# Patient Record
Sex: Female | Born: 1945 | Race: Black or African American | Hispanic: No | Marital: Married | Smoking: Former smoker
Health system: Southern US, Community
[De-identification: ages and names within clinical notes are randomized; demographics above are authoritative.]

## PROBLEM LIST (undated history)

## (undated) DIAGNOSIS — E7800 Pure hypercholesterolemia, unspecified: Secondary | ICD-10-CM

## (undated) DIAGNOSIS — C50919 Malignant neoplasm of unspecified site of unspecified female breast: Secondary | ICD-10-CM

## (undated) DIAGNOSIS — E039 Hypothyroidism, unspecified: Secondary | ICD-10-CM

## (undated) DIAGNOSIS — A6000 Herpesviral infection of urogenital system, unspecified: Secondary | ICD-10-CM

## (undated) HISTORY — DX: Herpesviral infection of urogenital system, unspecified: A60.00

## (undated) HISTORY — PX: BREAST SURGERY: SHX581

## (undated) HISTORY — DX: Hypothyroidism, unspecified: E03.9

## (undated) HISTORY — DX: Pure hypercholesterolemia, unspecified: E78.00

## (undated) HISTORY — PX: TUBAL LIGATION: SHX77

## (undated) HISTORY — PX: BREAST EXCISIONAL BIOPSY: SUR124

## (undated) HISTORY — DX: Malignant neoplasm of unspecified site of unspecified female breast: C50.919

---

## 2004-11-12 DIAGNOSIS — C50919 Malignant neoplasm of unspecified site of unspecified female breast: Secondary | ICD-10-CM

## 2004-11-12 HISTORY — PX: BREAST SURGERY: SHX581

## 2004-11-12 HISTORY — DX: Malignant neoplasm of unspecified site of unspecified female breast: C50.919

## 2005-05-03 ENCOUNTER — Encounter: Admission: RE | Admit: 2005-05-03 | Discharge: 2005-05-03 | Payer: Self-pay | Admitting: Internal Medicine

## 2006-07-26 ENCOUNTER — Encounter: Admission: RE | Admit: 2006-07-26 | Discharge: 2006-07-26 | Payer: Self-pay | Admitting: Internal Medicine

## 2006-08-06 ENCOUNTER — Encounter (INDEPENDENT_AMBULATORY_CARE_PROVIDER_SITE_OTHER): Payer: Self-pay | Admitting: Specialist

## 2006-08-06 ENCOUNTER — Encounter: Admission: RE | Admit: 2006-08-06 | Discharge: 2006-08-06 | Payer: Self-pay | Admitting: Internal Medicine

## 2006-08-06 ENCOUNTER — Encounter (INDEPENDENT_AMBULATORY_CARE_PROVIDER_SITE_OTHER): Payer: Self-pay | Admitting: Diagnostic Radiology

## 2006-08-14 ENCOUNTER — Ambulatory Visit (HOSPITAL_COMMUNITY): Admission: RE | Admit: 2006-08-14 | Discharge: 2006-08-14 | Payer: Self-pay | Admitting: Internal Medicine

## 2006-09-06 ENCOUNTER — Encounter: Admission: RE | Admit: 2006-09-06 | Discharge: 2006-09-06 | Payer: Self-pay | Admitting: General Surgery

## 2006-09-10 ENCOUNTER — Encounter: Admission: RE | Admit: 2006-09-10 | Discharge: 2006-09-10 | Payer: Self-pay | Admitting: General Surgery

## 2006-09-10 ENCOUNTER — Ambulatory Visit (HOSPITAL_BASED_OUTPATIENT_CLINIC_OR_DEPARTMENT_OTHER): Admission: RE | Admit: 2006-09-10 | Discharge: 2006-09-10 | Payer: Self-pay | Admitting: General Surgery

## 2006-09-10 ENCOUNTER — Encounter (INDEPENDENT_AMBULATORY_CARE_PROVIDER_SITE_OTHER): Payer: Self-pay | Admitting: Specialist

## 2006-09-17 ENCOUNTER — Ambulatory Visit: Payer: Self-pay | Admitting: Oncology

## 2006-09-19 ENCOUNTER — Ambulatory Visit: Admission: RE | Admit: 2006-09-19 | Discharge: 2006-10-22 | Payer: Self-pay | Admitting: *Deleted

## 2007-02-13 ENCOUNTER — Encounter: Admission: RE | Admit: 2007-02-13 | Discharge: 2007-02-13 | Payer: Self-pay | Admitting: General Surgery

## 2007-04-04 IMAGING — CR DG CHEST 2V
2 series · 2 of 2 positions shown · non-contrast
Comparison: None.

CLINICAL DATA: Preop respiratory exam for breast cancer.
 TWO VIEW CHEST:

[w chest pa]
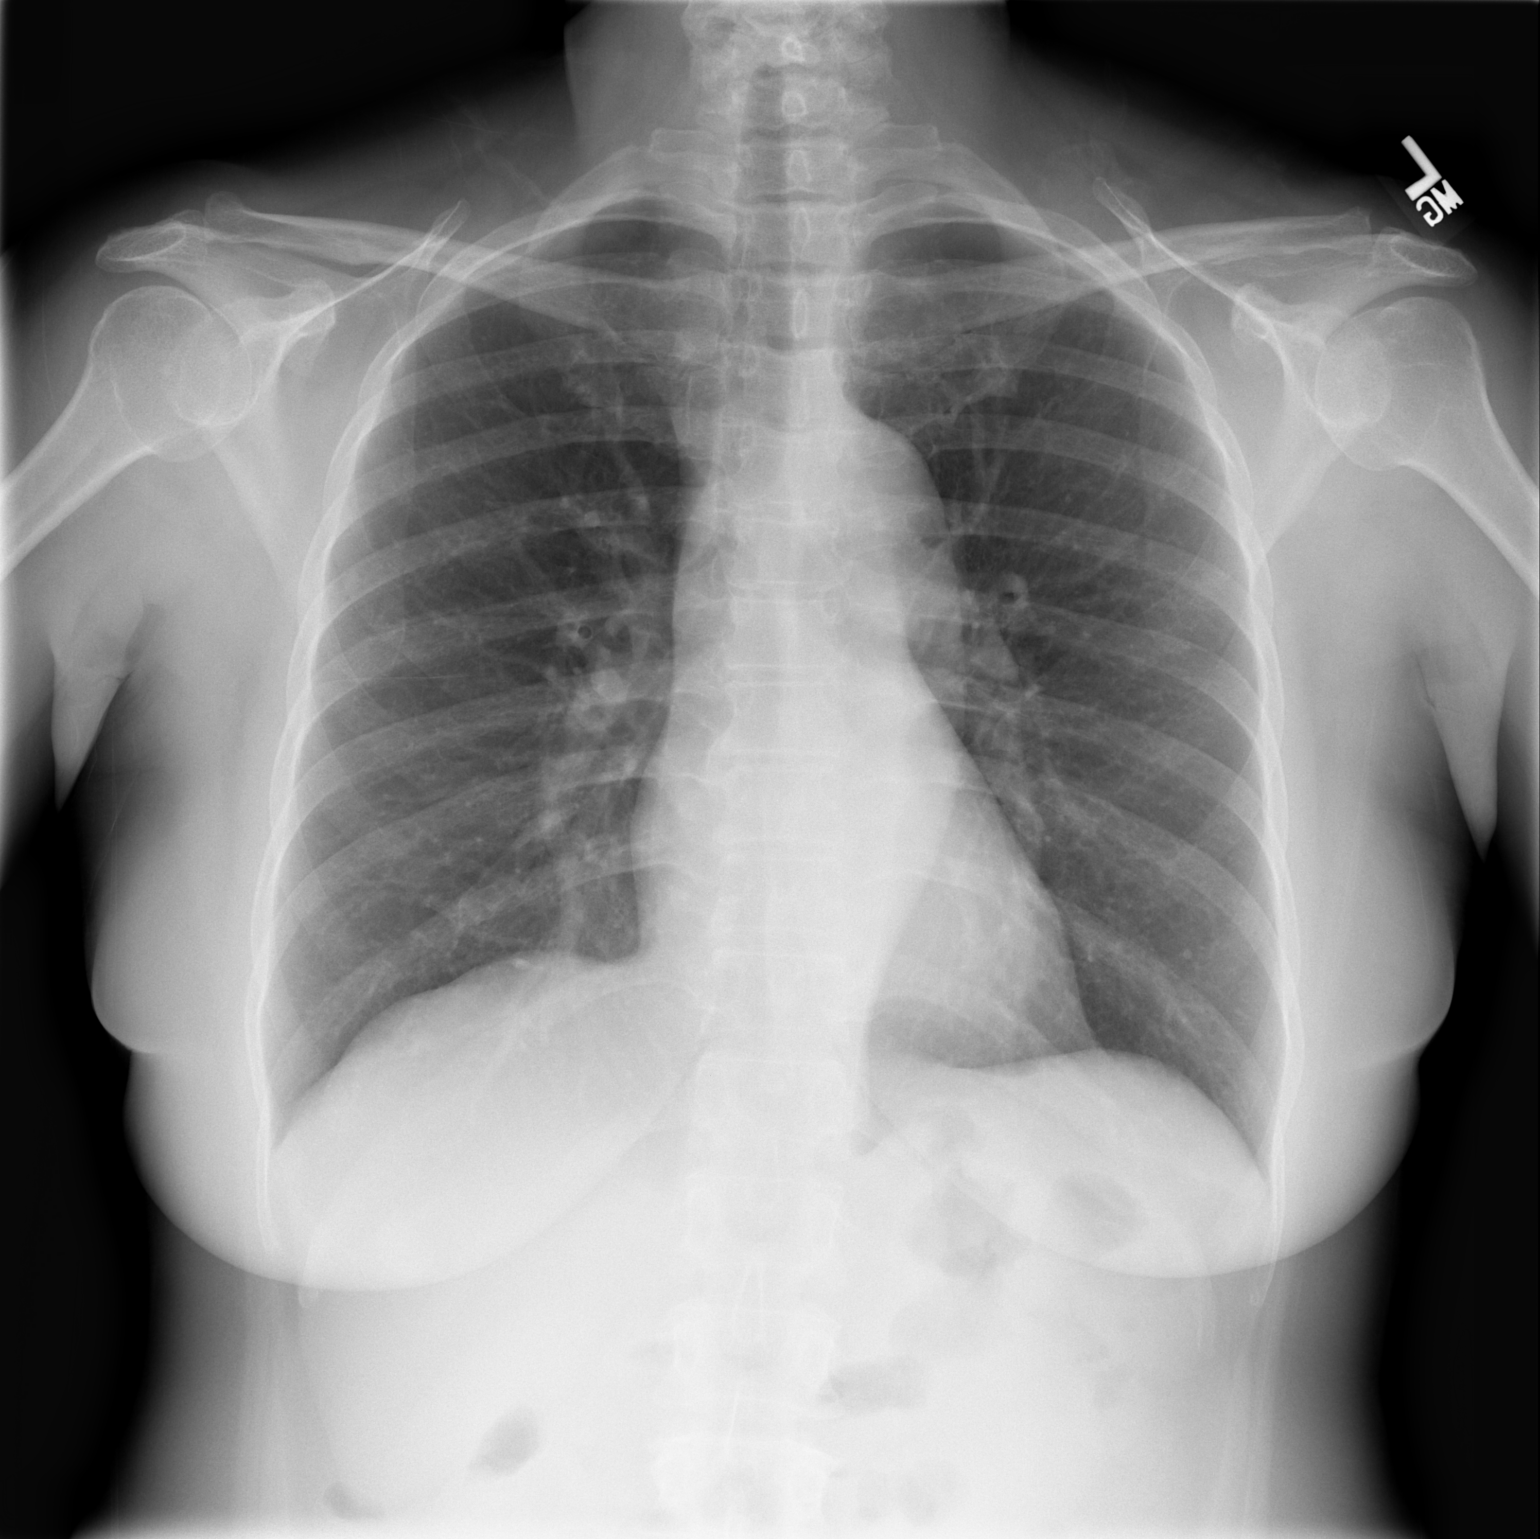

[w chest lat]
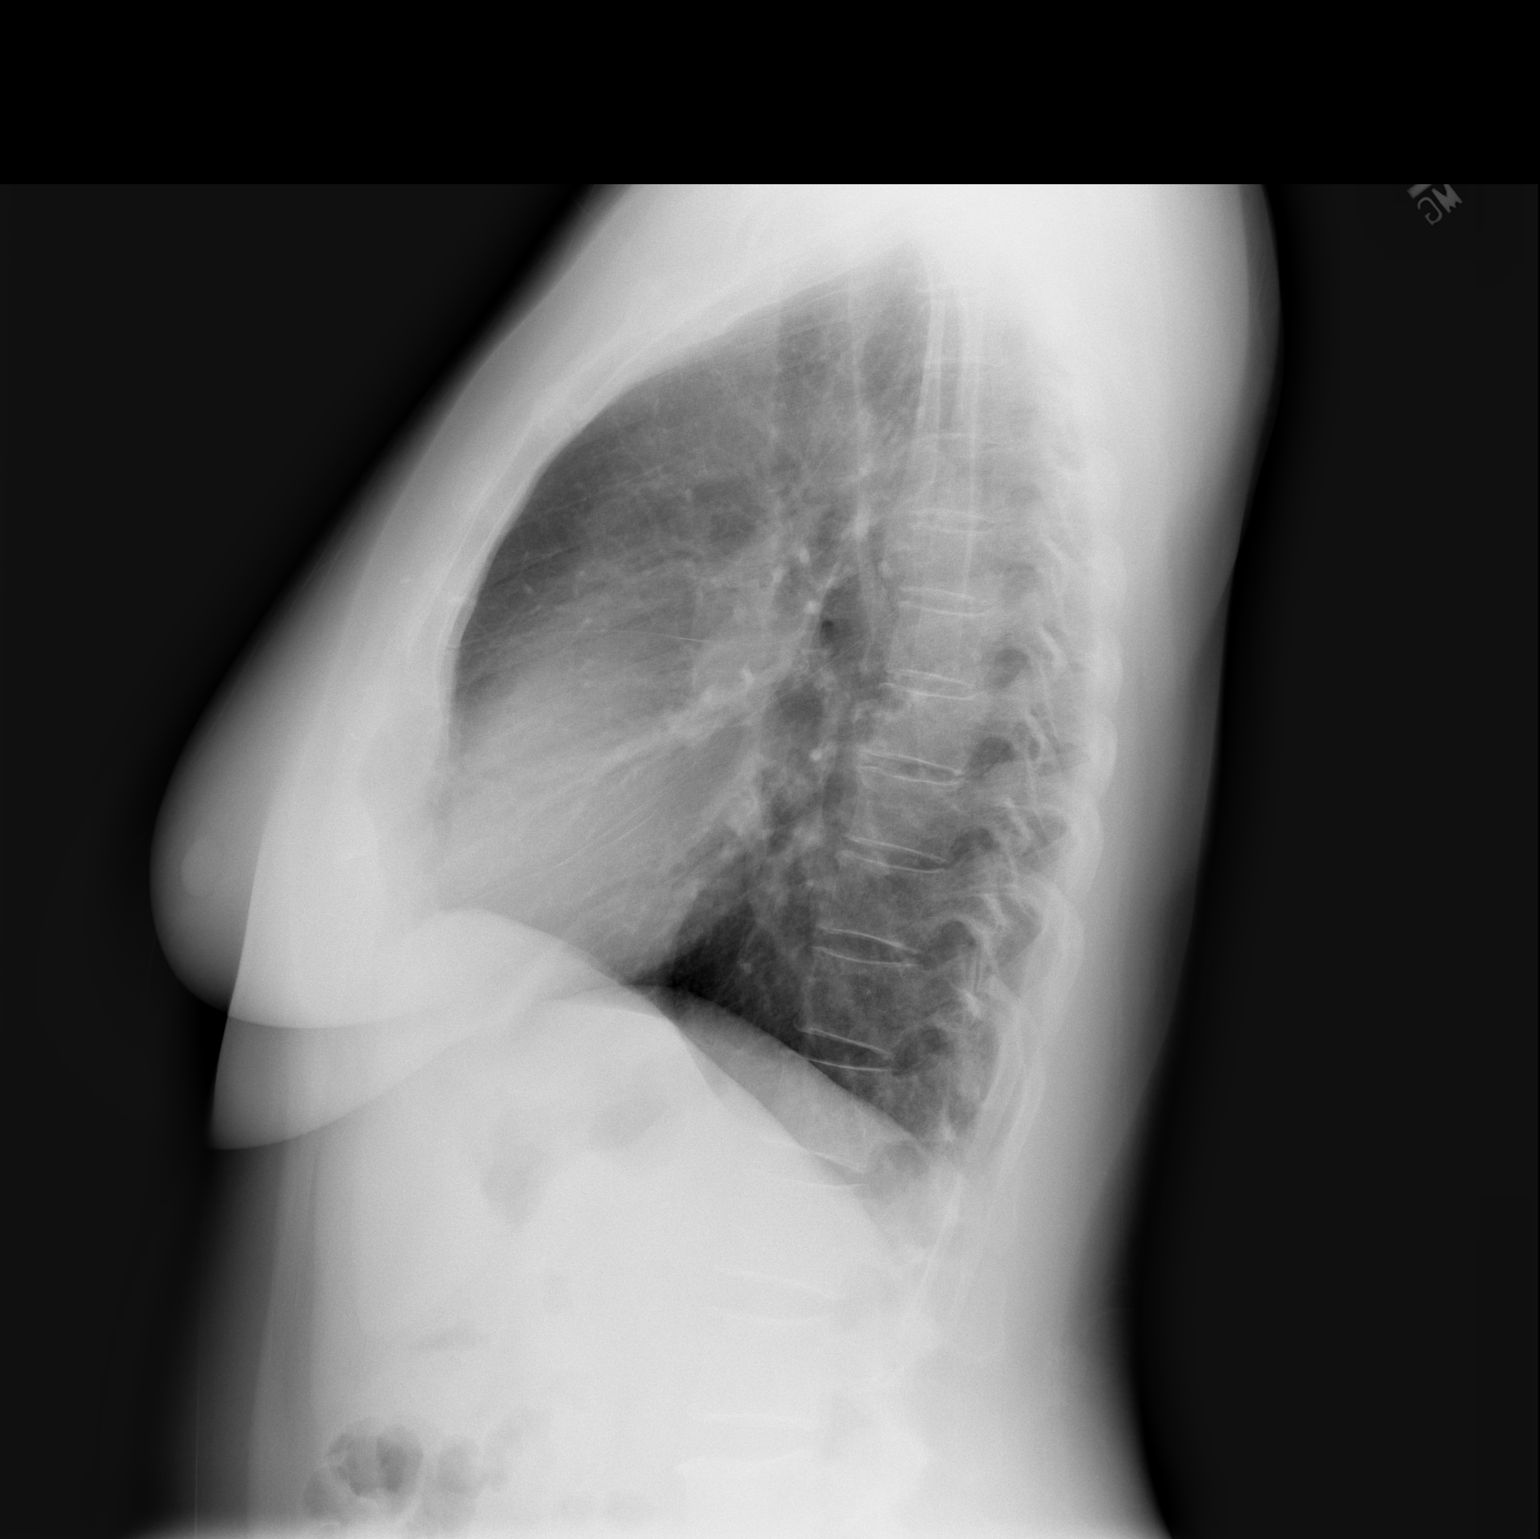

[2 of 2 positions shown; findings below may reference images not displayed]

The cardiac silhouette, mediastinum, and pulmonary vasculature are within normal limits.  Both lungs are clear.  The osseous structures are unremarkable.
IMPRESSION: Normal chest x-ray.

## 2008-08-17 ENCOUNTER — Encounter: Admission: RE | Admit: 2008-08-17 | Discharge: 2008-08-17 | Payer: Self-pay | Admitting: Internal Medicine

## 2008-09-17 ENCOUNTER — Other Ambulatory Visit: Admission: RE | Admit: 2008-09-17 | Discharge: 2008-09-17 | Payer: Self-pay | Admitting: Obstetrics & Gynecology

## 2009-08-31 ENCOUNTER — Encounter: Admission: RE | Admit: 2009-08-31 | Discharge: 2009-08-31 | Payer: Self-pay | Admitting: Internal Medicine

## 2010-09-28 ENCOUNTER — Encounter: Admission: RE | Admit: 2010-09-28 | Discharge: 2010-09-28 | Payer: Self-pay | Admitting: Internal Medicine

## 2010-12-03 ENCOUNTER — Encounter: Payer: Self-pay | Admitting: Internal Medicine

## 2011-03-30 NOTE — Op Note (Signed)
Cheryl Dean, Cheryl Dean           ACCOUNT NO.:  [id number]   MEDICAL RECORD NO.:  [id number]          PATIENT TYPE:  AMB   LOCATION:  DSC                          FACILITY:  [facility]   PHYSICIAN:  Timothy E. Earlene Plater, M.D. DATE OF BIRTH:  [birth year]   DATE OF PROCEDURE:  09/10/2006  DATE OF DISCHARGE:                                 OPERATIVE REPORT   PREOPERATIVE DIAGNOSIS:  Ductal carcinoma in situ left breast.   POSTOPERATIVE DIAGNOSIS:  Ductal carcinoma in situ left breast.   PROCEDURE:  Needle directed left partial mastectomy, blue dye injection and  sentinel node biopsy, left axilla.   SURGEON:  Timothy E. Earlene Plater, M.D.   ANESTHESIA:  General.   The patient has been seen and evaluated with abnormal mammograms, MRI, and  core biopsy all indicative of one small area in the left breast upper  quadrant with a DCIS.  She has been carefully counseled and is ready to  proceed with the surgery.  [facility]  excellent needle localization and was seen by nuclear medicine for injection  of sulfur colloid.  She is seen, identified, and the left breast marked.   The patient was taken to the operating room and placed supine.  General LMA  anesthesia was provided.  Her left arm was gently out stretched. The left  breast was inspected.  The wire entered the breast between the 12 and 1  o'clock positions angling posterior with a slight tilt to the inferior. By x-  ray, it penetrated the suspicious calcifications.  The nipple was prepped  and draped and blue dye injection was accomplished subcutaneously, then a  gentle massage carried out for five minutes.   The entire breast and chest was then prepped and draped in the usual  fashion.  Marcaine 0.25% plain was used prior to each incision.  The  NeoProbe was used and isolated one area of the axilla that was hot and this  area was anesthetized and opened. A single 1-cm lymph node that was not blue  was  identified.  It was dissected from the tissue. It was hot and was  submitted as node number one, hot not blue.  Further scrutiny of the axilla  revealed no further areas, no visible blue nodes, and no palpable nodes.  That wound was covered and attention was turned to the breast.   A curvilinear incision was designed just below the entrance of the needle  after Marcaine.  This was carried down and at the junction of the deep and  superficial subcu, a generous partial mastectomy was accomplished completely  encircling the wire and this was done down to the prepectoral fascia.  The  resulting mass was approximately 6 by 6 cm.  It was carefully marked with  sutures and indicated on the path report.  Bleeding had been controlled.  At  this point, we heard from the pathologist, Dr. Clelia Croft, that the lymph node was  negative.  Once the partial mastectomy had been submitted to x-ray, we  closed the axilla wound with Monocryl in layers.  The breast  wound was  carefully scrutinized for bleeding and was dry.  We then heard from x-ray  that the mass completely encompassed the needle, the shaft, and the  calcifications.  Therefore, the specimen was sent marked to pathology for  routine.  The wound was  closed in layers with Monocryl and Steri-Strips on both wounds.  Final  counts were correct.  She tolerated it well, was awakened, and taken to the  recovery room in good condition.  Written and verbal instructions are given  along with Vicodin, #36, and she will be seen and followed as an outpatient.      Timothy E. Earlene Plater, M.D.  Electronically Signed     TED/MEDQ  D:  09/10/2006  T:  09/10/2006  Job:  [id number]

## 2011-10-17 ENCOUNTER — Other Ambulatory Visit: Payer: Self-pay | Admitting: Internal Medicine

## 2011-10-17 DIAGNOSIS — Z853 Personal history of malignant neoplasm of breast: Secondary | ICD-10-CM

## 2011-11-01 ENCOUNTER — Ambulatory Visit
Admission: RE | Admit: 2011-11-01 | Discharge: 2011-11-01 | Disposition: A | Discharge status: Home / Self Care | Payer: Medicare Other | Source: Ambulatory Visit | Attending: Internal Medicine | Admitting: Internal Medicine

## 2011-11-01 ENCOUNTER — Other Ambulatory Visit: Payer: Self-pay | Admitting: Internal Medicine

## 2011-11-01 DIAGNOSIS — Z853 Personal history of malignant neoplasm of breast: Secondary | ICD-10-CM

## 2011-11-01 DIAGNOSIS — Z9889 Other specified postprocedural states: Secondary | ICD-10-CM

## 2011-12-18 ENCOUNTER — Other Ambulatory Visit: Payer: Self-pay | Admitting: Gastroenterology

## 2012-10-13 ENCOUNTER — Other Ambulatory Visit: Payer: Self-pay | Admitting: Internal Medicine

## 2012-10-13 DIAGNOSIS — Z853 Personal history of malignant neoplasm of breast: Secondary | ICD-10-CM

## 2012-10-13 DIAGNOSIS — Z9889 Other specified postprocedural states: Secondary | ICD-10-CM

## 2012-11-18 ENCOUNTER — Ambulatory Visit
Admission: RE | Admit: 2012-11-18 | Discharge: 2012-11-18 | Disposition: A | Discharge status: Home / Self Care | Payer: Medicare Other | Source: Ambulatory Visit | Attending: Internal Medicine | Admitting: Internal Medicine

## 2012-11-18 DIAGNOSIS — Z9889 Other specified postprocedural states: Secondary | ICD-10-CM

## 2012-11-18 DIAGNOSIS — Z853 Personal history of malignant neoplasm of breast: Secondary | ICD-10-CM

## 2012-12-16 ENCOUNTER — Encounter: Payer: Self-pay | Admitting: Obstetrics and Gynecology

## 2013-02-02 ENCOUNTER — Ambulatory Visit: Payer: Self-pay | Admitting: Obstetrics and Gynecology

## 2013-02-02 ENCOUNTER — Encounter: Payer: Self-pay | Admitting: Obstetrics and Gynecology

## 2013-02-02 ENCOUNTER — Ambulatory Visit (INDEPENDENT_AMBULATORY_CARE_PROVIDER_SITE_OTHER): Payer: Medicare Other | Admitting: Obstetrics and Gynecology

## 2013-02-02 VITALS — BP 122/80 | Ht 64.0 in | Wt 159.0 lb

## 2013-02-02 DIAGNOSIS — Z01419 Encounter for gynecological examination (general) (routine) without abnormal findings: Secondary | ICD-10-CM

## 2013-02-02 DIAGNOSIS — C50919 Malignant neoplasm of unspecified site of unspecified female breast: Secondary | ICD-10-CM

## 2013-02-02 DIAGNOSIS — L910 Hypertrophic scar: Secondary | ICD-10-CM

## 2013-02-02 DIAGNOSIS — C50912 Malignant neoplasm of unspecified site of left female breast: Secondary | ICD-10-CM | POA: Insufficient documentation

## 2013-02-02 NOTE — Progress Notes (Signed)
67 y.o. MarriedAfrican American female   [phone] here for annual exam.    Patient's last menstrual period was 02/02/2001.          Sexually active: yes  The current method of family planning is none.    Exercising:yes walking, strength } Last mammogram:  11/18/2012 normal Last pap smear:01/23/2011 Neg History of abnormal pap: none Smoking:no Alcohol:no Last colonoscopy:12/21/2011 normal every 22yrs Last Bone Density:  09/2011 Normal Last tetanus shot:2010 Last cholesterol check: 09/2012 Normal  Hgb:   pcp             Urine:pcp    Health Maintenance  Topic Date Due  . Influenza Vaccine  [birth year]1946  . Tetanus/tdap  03/05/1965  . Colonoscopy  03/05/1996  . Zostavax  03/05/2006  . Pneumococcal Polysaccharide Vaccine Age 36 And Over  03/06/2011  . Mammogram  11/18/2014    Family History  Problem Relation Age of Onset  . Cancer Sister     breast age 60  . Hypertension Father   . Heart attack Father     deceased from it  . Aortic aneurysm Mother     There is no problem list on file for this patient.   Past Medical History  Diagnosis Date  . Cancer of breast, female 11/12/2004    lumpectomy, sentinel node biopsy.  refused post op chem or RT  . Hypothyroidism   . Hypercholesterolemia   . Genital herpes     via HSV 2 pos serology    Past Surgical History  Procedure Laterality Date  . Tubal ligation    . Breast surgery Left 2006    Allergies: Review of patient's allergies indicates no known allergies.  Current Outpatient Prescriptions  Medication Sig Dispense Refill  . amLODipine (NORVASC) 5 MG tablet Take 5 mg by mouth daily.      . Ascorbic Acid (VITAMIN C) 100 MG tablet Take 100 mg by mouth daily. Pt unsure of dose      . Cyanocobalamin (VITAMIN B-12 PO) Take by mouth daily.      Marland Kitchen FOLIC ACID PO Take by mouth daily.      . IRON PO Take by mouth daily.      . Multiple Vitamins-Minerals (MULTIVITAMIN PO) Take by mouth daily.      Marland Kitchen POTASSIUM PO Take 99 mg by  mouth daily.      Marland Kitchen triamterene (DYRENIUM) 50 MG capsule Take 50 mg by mouth every morning. Patient reports she takes 25 mg, so dose is uncertain       No current facility-administered medications for this visit.    ROS: Pertinent items are noted in HPI.  Exam:    BP 122/80  Ht 5\' 4"  (1.626 m)  Wt 159 lb (72.122 kg)  BMI 27.28 kg/m2  LMP 02/02/2001   Wt Readings from Last 3 Encounters:  02/02/13 159 lb (72.122 kg)     Ht Readings from Last 3 Encounters:  02/02/13 5\' 4"  (1.626 m)    General appearance: alert, cooperative and appears stated age Head: Normocephalic, without obvious abnormality, atraumatic Neck: no adenopathy, supple, symmetrical, trachea midline and thyroid not enlarged, symmetric, no tenderness/mass/nodules Lungs: clear to auscultation bilaterally Breasts: Inspection negative, No nipple retraction or dimpling, No nipple discharge or bleeding, No axillary or supraclavicular adenopathy, Normal to palpation without dominant masses Heart: regular rate and rhythm Abdomen: soft, non-tender; bowel sounds normal; no masses,  no organomegaly Extremities: extremities normal, atraumatic, no cyanosis or edema Skin: Skin color, texture, turgor normal.  No rashes or lesions Lymph nodes: Cervical, supraclavicular, and axillary nodes normal. No abnormal inguinal nodes palpated Neurologic: Grossly normal   Pelvic: External genitalia:  no lesions              Urethra:  normal appearing urethra with no masses, tenderness or lesions              Bartholins and Skenes: normal                 Vagina: normal appearing vagina with normal color and discharge, no lesions              Cervix: normal appearance              Pap taken: no        Bimanual Exam:  Uterus:  uterus is normal size, shape, consistency and nontender                                      Adnexa: normal adnexa in size, nontender and no masses                                      Rectovaginal: Confirms                                       Anus:  normal sphincter tone, no lesions  A: well woman;  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 Clelia Croft for blood work and hasn't - encouraged to go.    P: mammogram  counseled on breast self exam, mammography screening, menopause, adequate intake of calcium and vitamin D, diet and exercise return annually or prn     An After Visit Summary was printed and given to the patient.

## 2013-02-02 NOTE — Patient Instructions (Signed)

## 2013-10-26 ENCOUNTER — Ambulatory Visit (INDEPENDENT_AMBULATORY_CARE_PROVIDER_SITE_OTHER): Payer: Medicare Other | Admitting: Podiatry

## 2013-10-26 ENCOUNTER — Ambulatory Visit (INDEPENDENT_AMBULATORY_CARE_PROVIDER_SITE_OTHER): Payer: Medicare Other

## 2013-10-26 ENCOUNTER — Encounter: Payer: Self-pay | Admitting: Podiatry

## 2013-10-26 VITALS — BP 114/66 | HR 92 | Resp 18

## 2013-10-26 DIAGNOSIS — M2040 Other hammer toe(s) (acquired), unspecified foot: Secondary | ICD-10-CM

## 2013-10-26 NOTE — Progress Notes (Signed)
° °  Subjective:    Patient ID: Cheryl Dean, female    DOB: 28-Mar-1946, 67 y.o.   MRN: [id number]  HPI my 5th toe on my right foot has been going on for about 1 year and hurts with certain shoes and sore and tender and I have filed it down  The patient is visit another podiatrist who is advised foot surgery. Patient also relates concern about possible keloid formation which was the problem after a breast surgery. She wants to know if keloid formation is a possibility after foot surgery.   Review of Systems  Constitutional: Negative.   HENT: Negative.   Eyes: Negative.   Respiratory: Negative.   Cardiovascular: Negative.   Gastrointestinal: Negative.   Genitourinary: Negative.   Musculoskeletal: Negative.   Skin: Negative.   Allergic/Immunologic: Negative.   Neurological: Negative.   Hematological: Negative.   Psychiatric/Behavioral: Negative.        Objective:   Physical Exam  Subjective: Orientated x71 67 year old black female presents with her husband  Vascular: The DP and PT pulses are two over four bilaterally.  Neurological : Knee and ankle reflexes equal and reactive bilaterally  Dermatological: Well-organized hyperkeratotic tissue over the dorsal lateral aspect of the fifth right toe.  Musculoskeletal: Flexible adductovarus fifth right toe noted.        Assessment & Plan:   Assessment: Adductovarus fifth right toe/hammertoe fifth right Hyperkeratotic lesion fifth right toe associated with deformity  Plan: The hyperkeratotic lesion was debrided and padded. Had a very detailed discussion with patient and her husband about treatment options which describes surgical and nonsurgical options. I made patient aware that hammertoe surgery could result in keloid formation. Patient this time we'll consider treatment options and will return for either further debridement at her request or for surgical intervention.

## 2013-10-26 NOTE — Patient Instructions (Signed)
Discussed treatment options for hammertoe deformities nonsurgical and surgical. Patient is advised that if she has a history of keloid formation toe surgery could create a keloid.Hammer Toes Hammer toes occur when the joint in one or more of your toes is permanently flexed. CAUSES  This happens when a muscle imbalance or abnormal bone length makes the small toes buckle under. This causes the toe joint to contract. This causes the tendons (cord like structure) to shorten.  SYMPTOMS   When hammer toes are flexible, you can straighten the buckled joint with your hand. Flexible hammer toes may develop into rigid hammer toes over time. Common symptoms of flexible hammer toes include:  Corns (build-ups of skin cells). Corns occur where boney bumps come in frequent contact with hard surfaces. For example, where your shoes press and rub.  Irritation.  Inflammation.  Pain.  Toe motion is limited.  When a rigid hammer toe is fixed you can no longer straighten the buckled joint. Corns, irritation, pain, and loss of motion is generally worse for rigid hammer toes than for flexible ones. TREATMENT  The problems noted above if painful or troublesome can be corrected with surgery. This is an elective surgery, so you can pick a convenient time for the procedure. The surgery may:  Improve appearance.  Relieve pain.  Improve function. You may be asked not to put weight on this foot for a few weeks. There are several types of surgical treatments. Common treatments are listed below. Your surgeon will discuss what is be best for you.   With arthroplasty, a portion of the joint is surgically removed and the toe is straightened. The "gap" fills in with fibroustissue. This helps with pain, deformity and function.  With fusion, cartilage between the two bones is taken out and the bones heal as one longer bone. This helps keep the toe stable and reduces pain but leaves the toe stiff, yet straight.  With  implant, a portion of the bone is removed and replaced with an implant to restore motion.  Flexor tendon transfers may be used to release the deforming force which buckles the toe. This is done by the repositioning of the tendons that curl the toes down (flexor tendons). Several of these options require fixing the toe with a pin that is visible at the tip of the toe. The pin keeps the toe straight during healing. It is generally removed in the office at 4-8 weeks after the corrective procedure. Generally, removing the pin is not painful.  LET YOUR CAREGIVER KNOW ABOUT:  Allergies.  Medications taken including herbs, eye drops, over the counter medications, and creams.  Use of steroids (by mouth or creams).  Previous problems with anesthetics or novocaine.  Possibility of pregnancy, if this applies.  History of blood clots (thrombophlebitis).  History of bleeding or blood problems.  Previous surgery.  Other health problems.  Family history of anesthetic problems. BEFORE THE PROCEDURE You should be present 60 minutes prior to your procedure unless otherwise directed by your caregiver.  RISKS AND COMPLICATIONS  If surgery is recommended, your caregiver will explain your foot problem and how surgery can improve it. Your caregiver can answer questions you may have about potential risks and complications involved.  Let your caregiver know about health changes prior to surgery. It is best to do elective surgeries when you are healthy. Be sure to ask your caregiver how long you will be off your feet and if you need to be off work. Plan accordingly. Your  foot and ankle may be immobilized by a cast (from your toes to below your knee). You may be asked not to bear weight on this foot for a few weeks. AFTER THE PROCEDURE You can bear weight as instructed. You may need a bandage, splint, and removable cast boot or surgical shoe for several weeks after surgery. You may resume normal diet and  activities as directed. Only take over-the-counter or prescription medicines for pain, discomfort, or fever as directed by your caregiver. SEEK MEDICAL CARE IF:   You have increased bleeding (more than a small spot) from the wound.  You notice redness, swelling, or increasing pain in the wound.  You notice pus coming from the wound or the pin that is used to stabilize the toe.  You notice a bad smell coming from the wound or dressing. SEEK IMMEDIATE MEDICAL CARE IF:   You have a fever.  You develop a rash.  You have difficulty breathing.  You have any allergic problems. Document Released: 10/26/2000 Document Revised: 01/21/2012 Document Reviewed: 11/26/2008 [facility] Patient Information 2014 [place], [place].

## 2014-01-22 ENCOUNTER — Other Ambulatory Visit: Payer: Self-pay | Admitting: Internal Medicine

## 2014-01-22 DIAGNOSIS — Z853 Personal history of malignant neoplasm of breast: Secondary | ICD-10-CM

## 2014-01-22 DIAGNOSIS — Z9889 Other specified postprocedural states: Secondary | ICD-10-CM

## 2014-02-02 ENCOUNTER — Ambulatory Visit
Admission: RE | Admit: 2014-02-02 | Discharge: 2014-02-02 | Disposition: A | Discharge status: Home / Self Care | Payer: Medicare Other | Source: Ambulatory Visit | Attending: Internal Medicine | Admitting: Internal Medicine

## 2014-02-02 DIAGNOSIS — Z853 Personal history of malignant neoplasm of breast: Secondary | ICD-10-CM

## 2014-02-02 DIAGNOSIS — Z9889 Other specified postprocedural states: Secondary | ICD-10-CM

## 2014-02-09 ENCOUNTER — Ambulatory Visit: Payer: Self-pay | Admitting: Obstetrics & Gynecology

## 2014-02-09 ENCOUNTER — Ambulatory Visit: Payer: Self-pay | Admitting: Obstetrics and Gynecology

## 2014-02-16 ENCOUNTER — Encounter: Payer: Self-pay | Admitting: Certified Nurse Midwife

## 2014-02-16 ENCOUNTER — Ambulatory Visit (INDEPENDENT_AMBULATORY_CARE_PROVIDER_SITE_OTHER): Payer: Medicare Other | Admitting: Certified Nurse Midwife

## 2014-02-16 VITALS — BP 124/68 | HR 68 | Resp 16 | Ht 64.0 in | Wt 154.0 lb

## 2014-02-16 DIAGNOSIS — Z01419 Encounter for gynecological examination (general) (routine) without abnormal findings: Secondary | ICD-10-CM

## 2014-02-16 DIAGNOSIS — C50919 Malignant neoplasm of unspecified site of unspecified female breast: Secondary | ICD-10-CM

## 2014-02-16 DIAGNOSIS — Z0000 Encounter for general adult medical examination without abnormal findings: Secondary | ICD-10-CM

## 2014-02-16 DIAGNOSIS — C50912 Malignant neoplasm of unspecified site of left female breast: Secondary | ICD-10-CM

## 2014-02-16 NOTE — Progress Notes (Signed)
68 y.o. [id number] Married African American Fe here to re establishcare and  for annual exam. Menopausal no HRT. Denies vaginal bleeding or vaginal dryness or any HSV 2 outbreaks. Sees PCP for cholesterol management with PCP management. All labs per PCP, sees every 6 months. No health issues today.  Patient's last menstrual period was 02/02/2001.          Sexually active: yes  The current method of family planning is tubal ligation.    Exercising: yes  walking & bike Smoker:  no  Health Maintenance: Pap:  01-23-11 neg no abnormals MMG:  02-02-14 normal Colonoscopy: 2013 repeat 5 years due to polyp BMD:   2012 normal per patient TDaP:  2010 Labs: pcp Self breast exam: done occ   reports that she has quit smoking. She does not have any smokeless tobacco history on file. She reports that she does not drink alcohol or use illicit drugs.  Past Medical History  Diagnosis Date  . Cancer of breast, female 11/12/2004    lumpectomy, sentinel node biopsy.  refused post op chem or RT  . Hypothyroidism   . Hypercholesterolemia   . Genital herpes     via HSV 2 pos serology    Past Surgical History  Procedure Laterality Date  . Tubal ligation    . Breast surgery Left 2006  . Breast surgery      left breast    Current Outpatient Prescriptions  Medication Sig Dispense Refill  . amLODipine (NORVASC) 5 MG tablet Take 5 mg by mouth daily.      . Ascorbic Acid (VITAMIN C) 100 MG tablet Take 100 mg by mouth daily. Pt unsure of dose      . Cyanocobalamin (VITAMIN B-12 PO) Take by mouth daily.      Marland Kitchen FOLIC ACID PO Take by mouth daily.      . IRON PO Take by mouth daily.      . Multiple Vitamins-Minerals (MULTIVITAMIN PO) Take by mouth daily.      Marland Kitchen POTASSIUM PO Take 99 mg by mouth as needed.       . triamterene-hydrochlorothiazide (MAXZIDE-25) 37.5-25 MG per tablet        No current facility-administered medications for this visit.    Family History  Problem Relation Age of Onset  . Breast  cancer Sister   . Hypertension Father   . Heart attack Father     deceased from it  . Aortic aneurysm Mother     ROS:  Pertinent items are noted in HPI.  Otherwise, a comprehensive ROS was negative.  Exam:   BP 124/68  Pulse 68  Resp 16  Ht 5\' 4"  (1.626 m)  Wt 154 lb (69.854 kg)  BMI 26.42 kg/m2  LMP 02/02/2001 Height: 5\' 4"  (162.6 cm)  Ht Readings from Last 3 Encounters:  02/16/14 5\' 4"  (1.626 m)  02/02/13 5\' 4"  (1.626 m)    General appearance: alert, cooperative and appears stated age Head: Normocephalic, without obvious abnormality, atraumatic Neck: no adenopathy, supple, symmetrical, trachea midline and thyroid normal to inspection and palpation and non-palpable Lungs: clear to auscultation bilaterally Breasts: normal appearance, no masses or tenderness, No nipple retraction or dimpling, No nipple discharge or bleeding, No axillary or supraclavicular adenopathy, right breast.  Left breast no masses, skin change or nipple discharge, lumpectomy scar in URQ and keloid scar in axillat noted. Heart: regular rate and rhythm Abdomen: soft, non-tender; no masses,  no organomegaly Extremities: extremities normal, atraumatic, no cyanosis or edema  Skin: Skin color, texture, turgor normal. No rashes or lesions Lymph nodes: Cervical, supraclavicular, and axillary nodes normal. No abnormal inguinal nodes palpated Neurologic: Grossly normal   Pelvic: External genitalia:  no lesions              Urethra:  normal appearing urethra with no masses, tenderness or lesions              Bartholin's and Skene's: normal                 Vagina: normal appearing vagina with normal color and discharge, no lesions              Cervix: normal, parous appearance, non tender              Pap taken: yes Bimanual Exam:  Uterus:  normal size, contour, position, consistency, mobility, non-tender and mid position              Adnexa: normal adnexa and no mass, fullness, tenderness                Rectovaginal: Confirms               Anus:  normal sphincter tone, no lesions  A:  Well Woman with normal exam  Menopausal no HRT  History of Left breast cancer with lymph node, lumpectomy only  Cholesterol elevation with PCP management  P:   Reviewed health and wellness pertinent to exam  Aware of need to advise if vaginal bleeding  Continue follow up as indicated  Pap smear as per guidelines   Mammogram yearly  pap smear taken today  counseled on breast self exam, mammography screening, menopause, adequate intake of calcium and vitamin D, diet and exercise  return annually or prn  An After Visit Summary was printed and given to the patient.

## 2014-02-16 NOTE — Patient Instructions (Signed)

## 2014-02-17 NOTE — Progress Notes (Signed)
Reviewed personally.  M. Suzanne Romari Gasparro, MD.  

## 2014-02-17 NOTE — Addendum Note (Signed)
Addended by: Regina Eck on: 02/17/2014 01:14 PM   Modules accepted: Orders

## 2014-02-19 LAB — IPS PAP SMEAR ONLY

## 2014-02-22 NOTE — Addendum Note (Signed)
Addended by: Regina Eck on: 02/22/2014 12:09 PM   Modules accepted: Orders

## 2014-02-25 ENCOUNTER — Telehealth: Payer: Self-pay | Admitting: Orthopedic Surgery

## 2014-02-25 LAB — IPS HPV ON A LIQUID BASED SPECIMEN

## 2014-02-25 NOTE — Telephone Encounter (Signed)
LMTCB for results. 

## 2014-09-13 ENCOUNTER — Encounter: Payer: Self-pay | Admitting: Certified Nurse Midwife

## 2014-11-10 ENCOUNTER — Other Ambulatory Visit: Payer: Self-pay | Admitting: Internal Medicine

## 2014-11-10 DIAGNOSIS — E785 Hyperlipidemia, unspecified: Secondary | ICD-10-CM

## 2014-11-18 ENCOUNTER — Ambulatory Visit
Admission: RE | Admit: 2014-11-18 | Discharge: 2014-11-18 | Disposition: A | Discharge status: Home / Self Care | Payer: No Typology Code available for payment source | Source: Ambulatory Visit | Attending: Internal Medicine | Admitting: Internal Medicine

## 2014-11-18 DIAGNOSIS — E785 Hyperlipidemia, unspecified: Secondary | ICD-10-CM

## 2015-02-22 ENCOUNTER — Telehealth: Payer: Self-pay | Admitting: Obstetrics & Gynecology

## 2015-02-22 ENCOUNTER — Ambulatory Visit: Payer: Medicare Other | Admitting: Obstetrics & Gynecology

## 2015-02-22 NOTE — Telephone Encounter (Signed)
Patient canceled appointment due to [facility] authorization. Janett Billow is working on getting this authorization.

## 2015-02-24 ENCOUNTER — Other Ambulatory Visit: Payer: Self-pay | Admitting: Plastic Surgery

## 2015-02-24 HISTORY — PX: KELOID EXCISION: SHX1856

## 2015-02-28 ENCOUNTER — Telehealth: Payer: Self-pay | Admitting: Obstetrics & Gynecology

## 2015-02-28 NOTE — Telephone Encounter (Signed)
Called patient about scheduling her annual appointment.

## 2015-03-08 ENCOUNTER — Encounter: Payer: Self-pay | Admitting: Obstetrics & Gynecology

## 2015-03-08 ENCOUNTER — Ambulatory Visit (INDEPENDENT_AMBULATORY_CARE_PROVIDER_SITE_OTHER): Payer: Medicare Other | Admitting: Obstetrics & Gynecology

## 2015-03-08 VITALS — BP 136/60 | HR 96 | Resp 20 | Ht 64.5 in | Wt 160.0 lb

## 2015-03-08 DIAGNOSIS — Z01419 Encounter for gynecological examination (general) (routine) without abnormal findings: Secondary | ICD-10-CM

## 2015-03-08 DIAGNOSIS — I10 Essential (primary) hypertension: Secondary | ICD-10-CM | POA: Diagnosis not present

## 2015-03-08 NOTE — Progress Notes (Signed)
69 y.o. [id number] MarriedAfrican AmericanF here for annual exam.  Doing well.  No vaginal bleeding.  Had keloid removed a couple of weeks ago.  Dr. Harlow Mares removed this.    PCP:  Dr. Brigitte Pulse.  Last appt 11/09/14.  Lab work done and this was normal, per pt.     Patient's last menstrual period was 02/02/2001.          Sexually active: Yes.    The current method of family planning is post menopausal status.   Exercising: Yes.    St[employer], walking Smoker:  no  Health Maintenance: Pap: 02/17/14 ASCUS. HR HPV:Neg History of abnormal Pap:  yes MMG: Diagnostic bilateral  02/02/14 BIRADS2:Benign Self Breast Check: yes, sometimes.  Hasn't done yet as just had large keloid removed from Dr. Harlow Mares Colonoscopy: 12/2011 Repeat 5 years--Dr. Michail Sermon BMD:   2012  TDaP:  2010  Screening Labs: PCP, Hb today: PCP, Urine today: PCP   reports that she has quit smoking. She has never used smokeless tobacco. She reports that she does not drink alcohol or use illicit drugs.  Past Medical History  Diagnosis Date  . Cancer of breast, female 11/12/2004    lumpectomy, sentinel node biopsy.  refused post op chem or RT  . Hypothyroidism   . Hypercholesterolemia   . Genital herpes     via HSV 2 pos serology    Past Surgical History  Procedure Laterality Date  . Tubal ligation    . Breast surgery Left 2006  . Breast surgery      left breast    Current Outpatient Prescriptions  Medication Sig Dispense Refill  . amLODipine (NORVASC) 5 MG tablet Take 5 mg by mouth daily.    . Ascorbic Acid (VITAMIN C) 100 MG tablet Take 100 mg by mouth daily. Pt unsure of dose    . Cyanocobalamin (VITAMIN B-12 PO) Take by mouth daily.    Marland Kitchen FOLIC ACID PO Take by mouth daily.    Marland Kitchen HYDROmorphone (DILAUDID) 2 MG tablet Take 1 tablet by mouth every 4 (four) hours as needed.  0  . IRON PO Take by mouth daily.    . methocarbamol (ROBAXIN) 500 MG tablet Take 1 tablet by mouth 4 (four) times daily as needed.  0  . Multiple  Vitamins-Minerals (MULTIVITAMIN PO) Take by mouth daily.    Marland Kitchen POTASSIUM PO Take 99 mg by mouth as needed.     . triamterene-hydrochlorothiazide (DYAZIDE) 37.5-25 MG per capsule Take 1 capsule by mouth daily.  3   No current facility-administered medications for this visit.    Family History  Problem Relation Age of Onset  . Breast cancer Sister   . Hypertension Father   . Heart attack Father     deceased from it  . Aortic aneurysm Mother     ROS:  Pertinent items are noted in HPI.  Otherwise, a comprehensive ROS was negative.  Exam:   BP 136/60 mmHg  Pulse 96  Resp 20  Ht 5' 4.5" (1.638 m)  Wt 160 lb (72.576 kg)  BMI 27.05 kg/m2  LMP 02/02/2001    Height: 5' 4.5" (163.8 cm)  Ht Readings from Last 3 Encounters:  03/08/15 5' 4.5" (1.638 m)  02/16/14 5\' 4"  (1.626 m)  02/02/13 5\' 4"  (1.626 m)    General appearance: alert, cooperative and appears stated age Head: Normocephalic, without obvious abnormality, atraumatic Neck: no adenopathy, supple, symmetrical, trachea midline and thyroid normal to inspection and palpation Lungs: clear to auscultation bilaterally  Breasts: normal appearance, no masses or tenderness, sutures under left axilla where keloid recently removed--incision healing well, no erythema or masses Heart: regular rate and rhythm Abdomen: soft, non-tender; bowel sounds normal; no masses,  no organomegaly Extremities: extremities normal, atraumatic, no cyanosis or edema Skin: Skin color, texture, turgor normal. No rashes or lesions Lymph nodes: Cervical, supraclavicular, and axillary nodes normal. No abnormal inguinal nodes palpated Neurologic: Grossly normal   Pelvic: External genitalia:  no lesions              Urethra:  normal appearing urethra with no masses, tenderness or lesions              Bartholins and Skenes: normal                 Vagina: normal appearing vagina with normal color and discharge, no lesions              Cervix: no lesions               Pap taken: No. Bimanual Exam:  Uterus:  normal size, contour, position, consistency, mobility, non-tender              Adnexa: normal adnexa and no mass, fullness, tenderness               Rectovaginal: Confirms               Anus:  normal sphincter tone, no lesions  Chaperone was present for exam.  A:  Well Woman with normal exam Menopausal no HRT History of left breast cancer with lymph node, lumpectomy only  Recent keloid removal, sutures still present Hypertension  P: MMG yearly.  Pt aware due but not going to scheduled until released from Dr. Harlow Mares  Pap last year--ASCUS with neg HR HPV.  No pap smear today Labs with Dr. Brigitte Pulse  Advised regarding pneumonia and shingles vaccines.  Declined.  AEX 1 year or follow up PRN

## 2015-05-04 ENCOUNTER — Other Ambulatory Visit: Payer: Self-pay

## 2015-05-04 DIAGNOSIS — Z1231 Encounter for screening mammogram for malignant neoplasm of breast: Secondary | ICD-10-CM

## 2015-06-02 ENCOUNTER — Ambulatory Visit
Admission: RE | Admit: 2015-06-02 | Discharge: 2015-06-02 | Disposition: A | Discharge status: Home / Self Care | Payer: Medicare Other | Source: Ambulatory Visit

## 2015-06-02 DIAGNOSIS — Z1231 Encounter for screening mammogram for malignant neoplasm of breast: Secondary | ICD-10-CM

## 2015-12-19 DIAGNOSIS — R52 Pain, unspecified: Secondary | ICD-10-CM | POA: Diagnosis not present

## 2015-12-19 DIAGNOSIS — L905 Scar conditions and fibrosis of skin: Secondary | ICD-10-CM | POA: Diagnosis not present

## 2015-12-19 DIAGNOSIS — L910 Hypertrophic scar: Secondary | ICD-10-CM | POA: Diagnosis not present

## 2015-12-27 DIAGNOSIS — Z780 Asymptomatic menopausal state: Secondary | ICD-10-CM | POA: Diagnosis not present

## 2016-02-20 DIAGNOSIS — L905 Scar conditions and fibrosis of skin: Secondary | ICD-10-CM | POA: Diagnosis not present

## 2016-02-20 DIAGNOSIS — L910 Hypertrophic scar: Secondary | ICD-10-CM | POA: Diagnosis not present

## 2016-02-20 DIAGNOSIS — R52 Pain, unspecified: Secondary | ICD-10-CM | POA: Diagnosis not present

## 2016-02-23 DIAGNOSIS — E038 Other specified hypothyroidism: Secondary | ICD-10-CM | POA: Diagnosis not present

## 2016-04-02 DIAGNOSIS — L910 Hypertrophic scar: Secondary | ICD-10-CM | POA: Diagnosis not present

## 2016-07-23 DIAGNOSIS — L910 Hypertrophic scar: Secondary | ICD-10-CM | POA: Diagnosis not present

## 2016-07-30 ENCOUNTER — Other Ambulatory Visit: Payer: Self-pay | Admitting: Internal Medicine

## 2016-07-30 DIAGNOSIS — Z1231 Encounter for screening mammogram for malignant neoplasm of breast: Secondary | ICD-10-CM

## 2016-08-09 ENCOUNTER — Ambulatory Visit
Admission: RE | Admit: 2016-08-09 | Discharge: 2016-08-09 | Disposition: A | Discharge status: Home / Self Care | Payer: Medicare Other | Source: Ambulatory Visit | Attending: Internal Medicine | Admitting: Internal Medicine

## 2016-08-09 DIAGNOSIS — Z1231 Encounter for screening mammogram for malignant neoplasm of breast: Secondary | ICD-10-CM

## 2016-10-09 DIAGNOSIS — H5203 Hypermetropia, bilateral: Secondary | ICD-10-CM | POA: Diagnosis not present

## 2016-11-27 DIAGNOSIS — R7301 Impaired fasting glucose: Secondary | ICD-10-CM | POA: Diagnosis not present

## 2016-11-27 DIAGNOSIS — I10 Essential (primary) hypertension: Secondary | ICD-10-CM | POA: Diagnosis not present

## 2016-11-27 DIAGNOSIS — E784 Other hyperlipidemia: Secondary | ICD-10-CM | POA: Diagnosis not present

## 2016-11-27 DIAGNOSIS — E038 Other specified hypothyroidism: Secondary | ICD-10-CM | POA: Diagnosis not present

## 2016-12-06 DIAGNOSIS — Z6825 Body mass index (BMI) 25.0-25.9, adult: Secondary | ICD-10-CM | POA: Diagnosis not present

## 2016-12-06 DIAGNOSIS — Z1389 Encounter for screening for other disorder: Secondary | ICD-10-CM | POA: Diagnosis not present

## 2016-12-06 DIAGNOSIS — R7301 Impaired fasting glucose: Secondary | ICD-10-CM | POA: Diagnosis not present

## 2016-12-06 DIAGNOSIS — I10 Essential (primary) hypertension: Secondary | ICD-10-CM | POA: Diagnosis not present

## 2016-12-06 DIAGNOSIS — E784 Other hyperlipidemia: Secondary | ICD-10-CM | POA: Diagnosis not present

## 2016-12-06 DIAGNOSIS — Z0000 Encounter for general adult medical examination without abnormal findings: Secondary | ICD-10-CM | POA: Diagnosis not present

## 2016-12-06 DIAGNOSIS — E039 Hypothyroidism, unspecified: Secondary | ICD-10-CM | POA: Diagnosis not present

## 2016-12-21 DIAGNOSIS — Z1212 Encounter for screening for malignant neoplasm of rectum: Secondary | ICD-10-CM | POA: Diagnosis not present

## 2017-03-26 DIAGNOSIS — D122 Benign neoplasm of ascending colon: Secondary | ICD-10-CM | POA: Diagnosis not present

## 2017-03-26 DIAGNOSIS — K5730 Diverticulosis of large intestine without perforation or abscess without bleeding: Secondary | ICD-10-CM | POA: Diagnosis not present

## 2017-03-26 DIAGNOSIS — K640 First degree hemorrhoids: Secondary | ICD-10-CM | POA: Diagnosis not present

## 2017-03-26 DIAGNOSIS — Z86010 Personal history of colonic polyps: Secondary | ICD-10-CM | POA: Diagnosis not present

## 2017-03-28 DIAGNOSIS — D122 Benign neoplasm of ascending colon: Secondary | ICD-10-CM | POA: Diagnosis not present

## 2017-08-06 ENCOUNTER — Other Ambulatory Visit: Payer: Self-pay | Admitting: Internal Medicine

## 2017-08-06 DIAGNOSIS — Z1231 Encounter for screening mammogram for malignant neoplasm of breast: Secondary | ICD-10-CM

## 2017-08-20 ENCOUNTER — Ambulatory Visit
Admission: RE | Admit: 2017-08-20 | Discharge: 2017-08-20 | Disposition: A | Discharge status: Home / Self Care | Payer: Medicare Other | Source: Ambulatory Visit | Attending: Internal Medicine | Admitting: Internal Medicine

## 2017-08-20 DIAGNOSIS — Z1231 Encounter for screening mammogram for malignant neoplasm of breast: Secondary | ICD-10-CM

## 2017-12-05 DIAGNOSIS — R82998 Other abnormal findings in urine: Secondary | ICD-10-CM | POA: Diagnosis not present

## 2017-12-05 DIAGNOSIS — E7849 Other hyperlipidemia: Secondary | ICD-10-CM | POA: Diagnosis not present

## 2017-12-05 DIAGNOSIS — R7301 Impaired fasting glucose: Secondary | ICD-10-CM | POA: Diagnosis not present

## 2017-12-05 DIAGNOSIS — E038 Other specified hypothyroidism: Secondary | ICD-10-CM | POA: Diagnosis not present

## 2017-12-05 DIAGNOSIS — I10 Essential (primary) hypertension: Secondary | ICD-10-CM | POA: Diagnosis not present

## 2017-12-10 DIAGNOSIS — Z1212 Encounter for screening for malignant neoplasm of rectum: Secondary | ICD-10-CM | POA: Diagnosis not present

## 2017-12-12 DIAGNOSIS — E7849 Other hyperlipidemia: Secondary | ICD-10-CM | POA: Diagnosis not present

## 2017-12-12 DIAGNOSIS — Z0000 Encounter for general adult medical examination without abnormal findings: Secondary | ICD-10-CM | POA: Diagnosis not present

## 2017-12-12 DIAGNOSIS — I10 Essential (primary) hypertension: Secondary | ICD-10-CM | POA: Diagnosis not present

## 2017-12-12 DIAGNOSIS — R7301 Impaired fasting glucose: Secondary | ICD-10-CM | POA: Diagnosis not present

## 2018-09-30 ENCOUNTER — Other Ambulatory Visit: Payer: Self-pay | Admitting: Internal Medicine

## 2018-09-30 DIAGNOSIS — Z1231 Encounter for screening mammogram for malignant neoplasm of breast: Secondary | ICD-10-CM

## 2018-11-27 ENCOUNTER — Ambulatory Visit
Admission: RE | Admit: 2018-11-27 | Discharge: 2018-11-27 | Disposition: A | Discharge status: Home / Self Care | Payer: Medicare Other | Source: Ambulatory Visit | Attending: Internal Medicine | Admitting: Internal Medicine

## 2018-11-27 DIAGNOSIS — Z1231 Encounter for screening mammogram for malignant neoplasm of breast: Secondary | ICD-10-CM

## 2018-12-09 DIAGNOSIS — E038 Other specified hypothyroidism: Secondary | ICD-10-CM | POA: Diagnosis not present

## 2018-12-09 DIAGNOSIS — I10 Essential (primary) hypertension: Secondary | ICD-10-CM | POA: Diagnosis not present

## 2018-12-09 DIAGNOSIS — R82998 Other abnormal findings in urine: Secondary | ICD-10-CM | POA: Diagnosis not present

## 2018-12-09 DIAGNOSIS — R7301 Impaired fasting glucose: Secondary | ICD-10-CM | POA: Diagnosis not present

## 2018-12-09 DIAGNOSIS — E7849 Other hyperlipidemia: Secondary | ICD-10-CM | POA: Diagnosis not present

## 2018-12-16 DIAGNOSIS — I10 Essential (primary) hypertension: Secondary | ICD-10-CM | POA: Diagnosis not present

## 2018-12-16 DIAGNOSIS — R7301 Impaired fasting glucose: Secondary | ICD-10-CM | POA: Diagnosis not present

## 2018-12-16 DIAGNOSIS — E7849 Other hyperlipidemia: Secondary | ICD-10-CM | POA: Diagnosis not present

## 2018-12-16 DIAGNOSIS — Z0000 Encounter for general adult medical examination without abnormal findings: Secondary | ICD-10-CM | POA: Diagnosis not present

## 2018-12-19 DIAGNOSIS — Z1212 Encounter for screening for malignant neoplasm of rectum: Secondary | ICD-10-CM | POA: Diagnosis not present

## 2019-06-25 IMAGING — MG DIGITAL SCREENING BILATERAL MAMMOGRAM WITH TOMO AND CAD
8 series · 8 of 24 positions shown · non-contrast
Comparison: Previous exam(s).

ACR Breast Density Category a: The breast tissue is almost entirely
fatty.

CLINICAL DATA: Screening.

EXAM:
DIGITAL SCREENING BILATERAL MAMMOGRAM WITH TOMO AND CAD

[L MLO synth-2D]
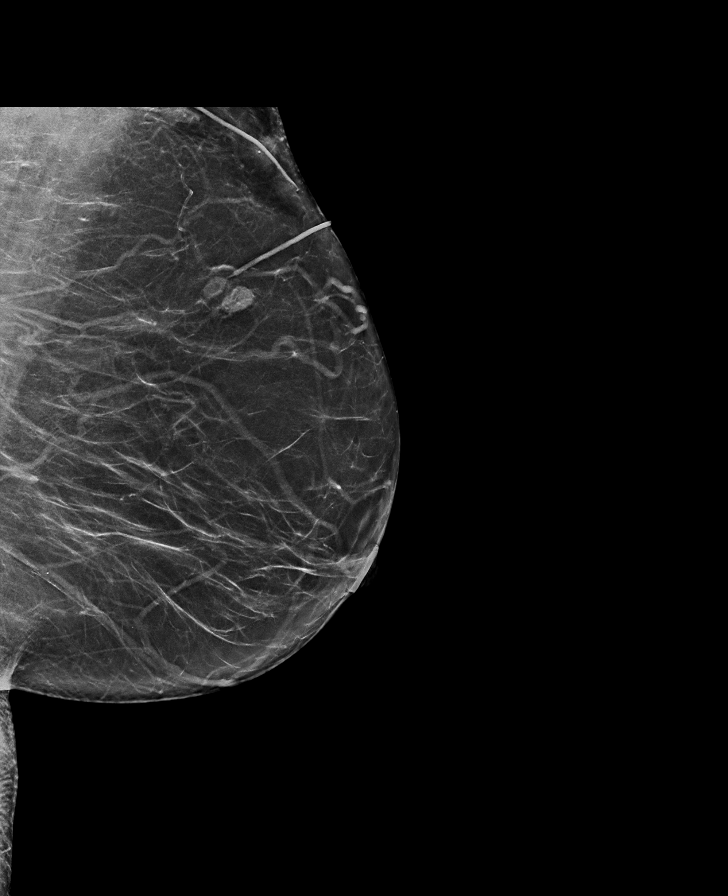

[R CC synth-2D]
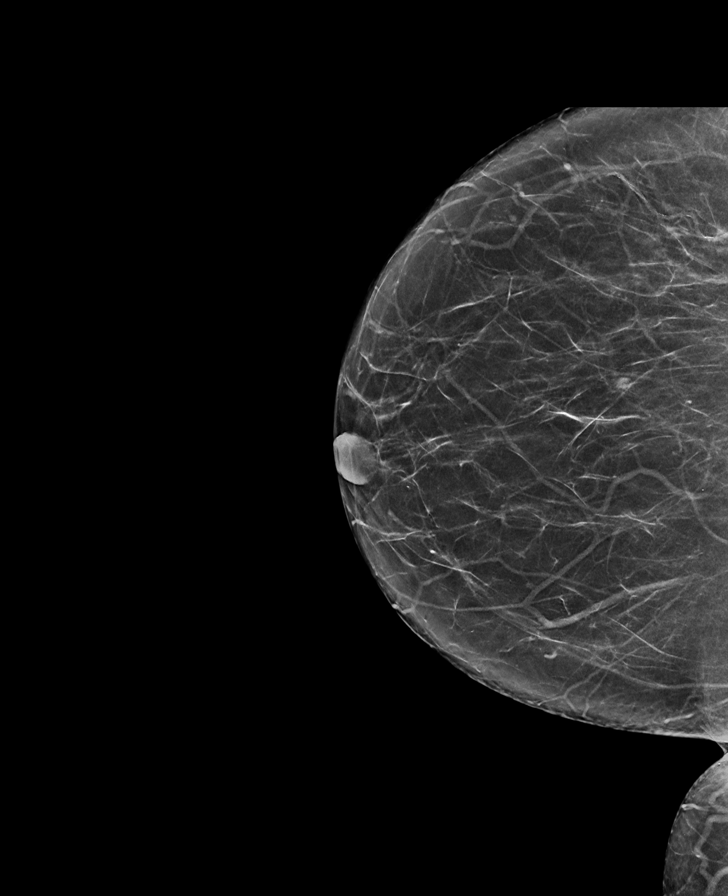

[L CC synth-2D]
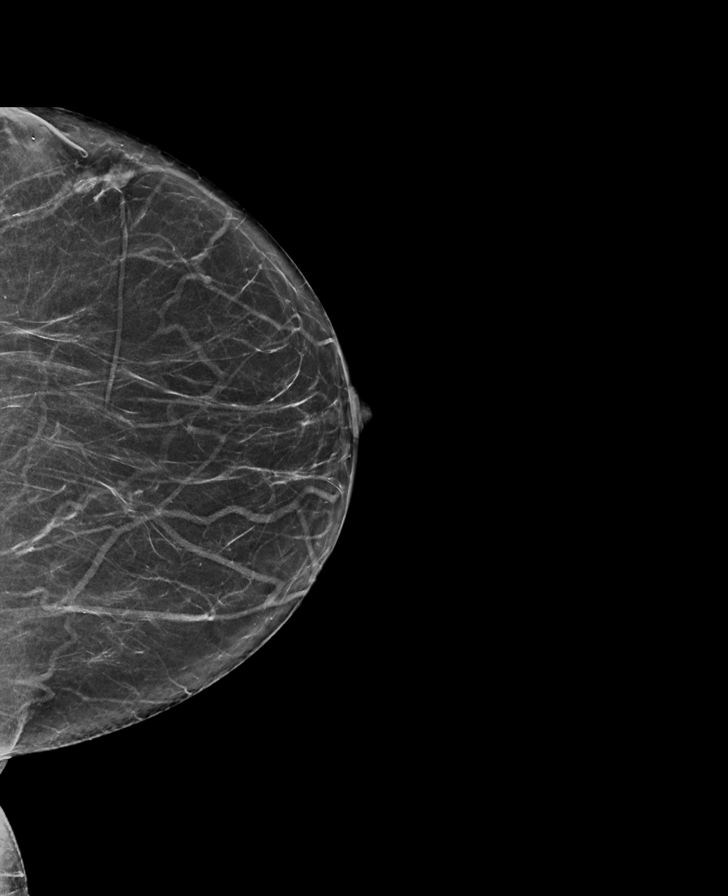

[R MLO synth-2D]
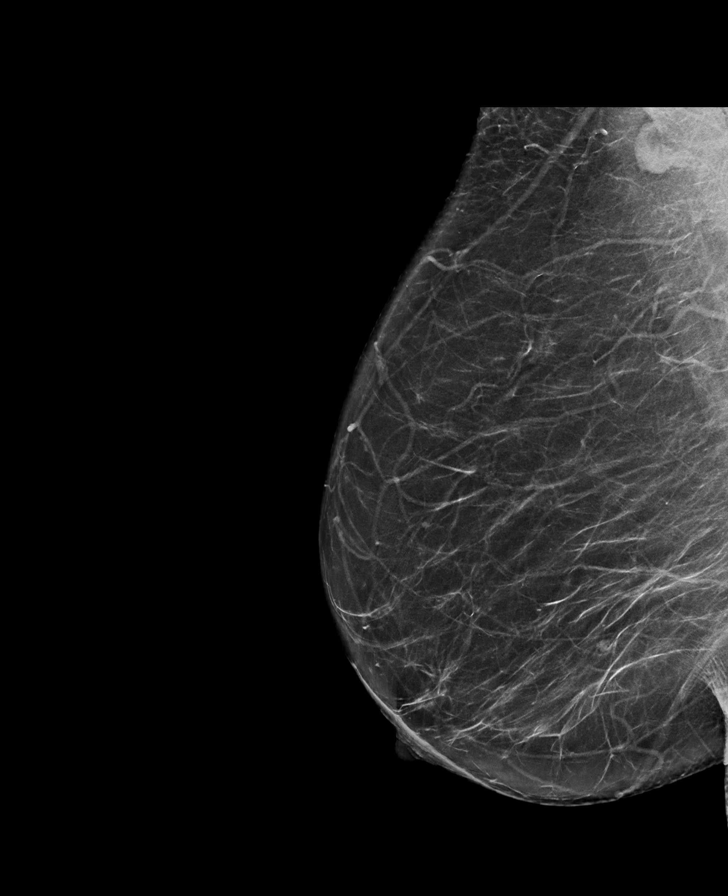

[R MLO tomo · tomo slice 39/78.0]
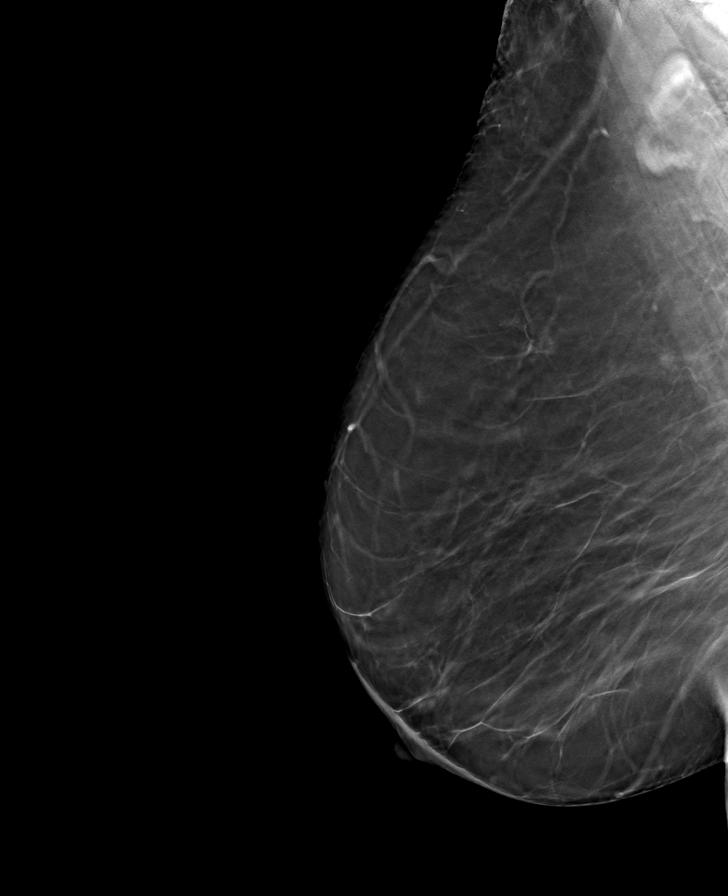

[L MLO tomo · tomo slice 39/77.0]
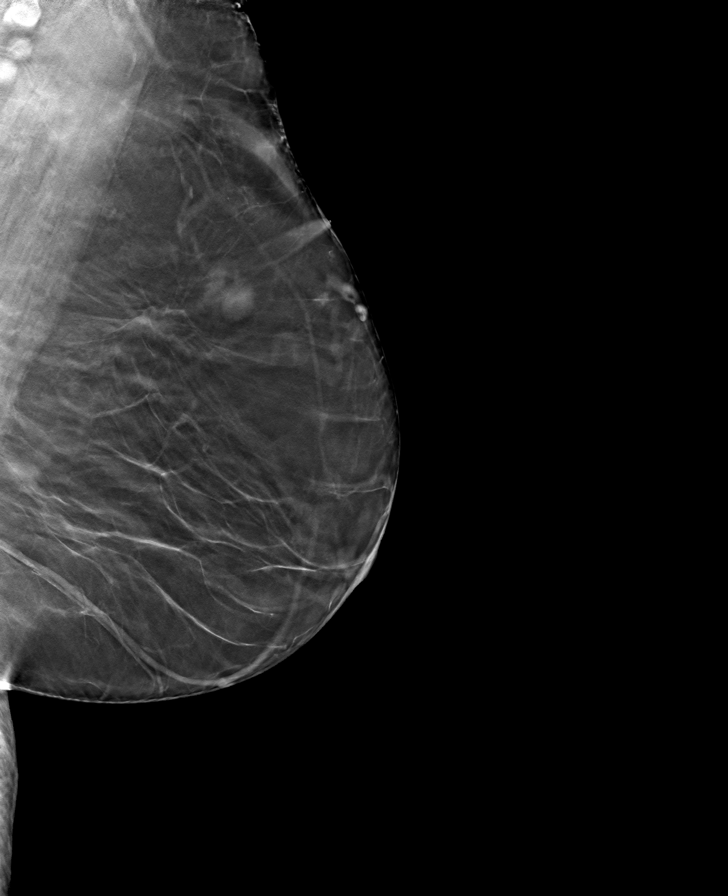

[R CC tomo · tomo slice 35/68.0]
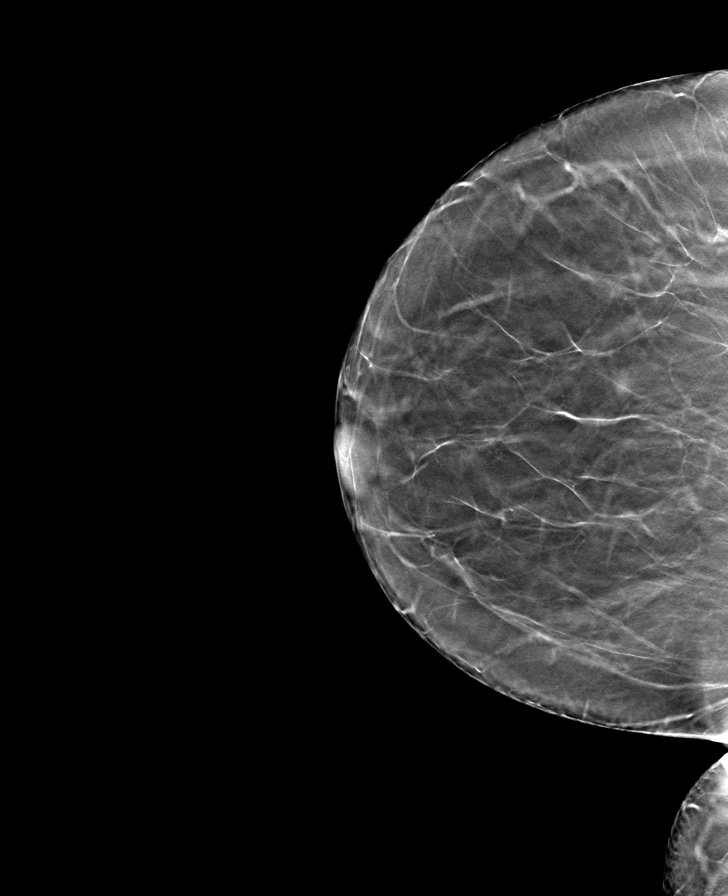

[L CC tomo · tomo slice 34/67.0]
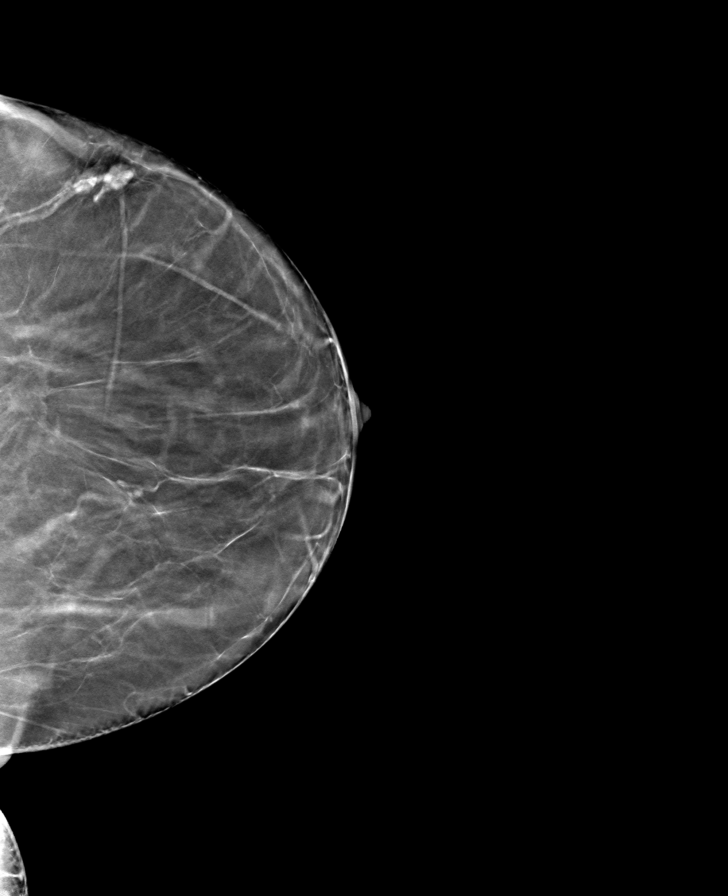

[8 of 24 positions shown; findings below may reference images not displayed]

FINDINGS: There are no findings suspicious for malignancy. Images were
processed with CAD.
IMPRESSION: No mammographic evidence of malignancy. A result letter of this
screening mammogram will be mailed directly to the patient.

RECOMMENDATION:
Screening mammogram in one year. (Code:8Y-[id number])

BI-RADS CATEGORY  1: Negative.

## 2019-11-17 ENCOUNTER — Other Ambulatory Visit: Payer: Self-pay | Admitting: Internal Medicine

## 2019-11-17 DIAGNOSIS — Z1231 Encounter for screening mammogram for malignant neoplasm of breast: Secondary | ICD-10-CM

## 2019-12-17 DIAGNOSIS — Z0000 Encounter for general adult medical examination without abnormal findings: Secondary | ICD-10-CM | POA: Diagnosis not present

## 2019-12-17 DIAGNOSIS — E038 Other specified hypothyroidism: Secondary | ICD-10-CM | POA: Diagnosis not present

## 2019-12-17 DIAGNOSIS — E7849 Other hyperlipidemia: Secondary | ICD-10-CM | POA: Diagnosis not present

## 2019-12-17 DIAGNOSIS — R7301 Impaired fasting glucose: Secondary | ICD-10-CM | POA: Diagnosis not present

## 2019-12-22 DIAGNOSIS — R82998 Other abnormal findings in urine: Secondary | ICD-10-CM | POA: Diagnosis not present

## 2019-12-22 DIAGNOSIS — I10 Essential (primary) hypertension: Secondary | ICD-10-CM | POA: Diagnosis not present

## 2019-12-24 DIAGNOSIS — I10 Essential (primary) hypertension: Secondary | ICD-10-CM | POA: Diagnosis not present

## 2019-12-24 DIAGNOSIS — Z0000 Encounter for general adult medical examination without abnormal findings: Secondary | ICD-10-CM | POA: Diagnosis not present

## 2019-12-24 DIAGNOSIS — R7301 Impaired fasting glucose: Secondary | ICD-10-CM | POA: Diagnosis not present

## 2019-12-24 DIAGNOSIS — E785 Hyperlipidemia, unspecified: Secondary | ICD-10-CM | POA: Diagnosis not present

## 2019-12-28 ENCOUNTER — Other Ambulatory Visit: Payer: Self-pay

## 2019-12-28 ENCOUNTER — Ambulatory Visit
Admission: RE | Admit: 2019-12-28 | Discharge: 2019-12-28 | Disposition: A | Discharge status: Home / Self Care | Payer: Medicare Other | Source: Ambulatory Visit | Attending: Internal Medicine | Admitting: Internal Medicine

## 2019-12-28 DIAGNOSIS — Z1231 Encounter for screening mammogram for malignant neoplasm of breast: Secondary | ICD-10-CM | POA: Diagnosis not present

## 2019-12-30 DIAGNOSIS — Z1212 Encounter for screening for malignant neoplasm of rectum: Secondary | ICD-10-CM | POA: Diagnosis not present

## 2020-12-20 DIAGNOSIS — I10 Essential (primary) hypertension: Secondary | ICD-10-CM | POA: Diagnosis not present

## 2020-12-20 DIAGNOSIS — R7301 Impaired fasting glucose: Secondary | ICD-10-CM | POA: Diagnosis not present

## 2020-12-20 DIAGNOSIS — E785 Hyperlipidemia, unspecified: Secondary | ICD-10-CM | POA: Diagnosis not present

## 2020-12-20 DIAGNOSIS — E039 Hypothyroidism, unspecified: Secondary | ICD-10-CM | POA: Diagnosis not present

## 2020-12-27 DIAGNOSIS — Z1212 Encounter for screening for malignant neoplasm of rectum: Secondary | ICD-10-CM | POA: Diagnosis not present

## 2020-12-27 DIAGNOSIS — R82998 Other abnormal findings in urine: Secondary | ICD-10-CM | POA: Diagnosis not present

## 2020-12-27 DIAGNOSIS — I10 Essential (primary) hypertension: Secondary | ICD-10-CM | POA: Diagnosis not present

## 2022-01-02 DIAGNOSIS — E785 Hyperlipidemia, unspecified: Secondary | ICD-10-CM | POA: Diagnosis not present

## 2022-01-02 DIAGNOSIS — E039 Hypothyroidism, unspecified: Secondary | ICD-10-CM | POA: Diagnosis not present

## 2022-01-02 DIAGNOSIS — R7301 Impaired fasting glucose: Secondary | ICD-10-CM | POA: Diagnosis not present

## 2022-01-02 DIAGNOSIS — I10 Essential (primary) hypertension: Secondary | ICD-10-CM | POA: Diagnosis not present

## 2022-01-09 ENCOUNTER — Other Ambulatory Visit: Payer: Self-pay | Admitting: Internal Medicine

## 2022-01-09 DIAGNOSIS — E785 Hyperlipidemia, unspecified: Secondary | ICD-10-CM

## 2022-05-18 DIAGNOSIS — H2512 Age-related nuclear cataract, left eye: Secondary | ICD-10-CM | POA: Diagnosis not present

## 2022-05-18 DIAGNOSIS — H2511 Age-related nuclear cataract, right eye: Secondary | ICD-10-CM | POA: Diagnosis not present

## 2022-06-14 DIAGNOSIS — H2512 Age-related nuclear cataract, left eye: Secondary | ICD-10-CM | POA: Diagnosis not present

## 2022-06-14 DIAGNOSIS — H269 Unspecified cataract: Secondary | ICD-10-CM | POA: Diagnosis not present

## 2022-07-19 DIAGNOSIS — H2511 Age-related nuclear cataract, right eye: Secondary | ICD-10-CM | POA: Diagnosis not present

## 2022-07-19 DIAGNOSIS — H269 Unspecified cataract: Secondary | ICD-10-CM | POA: Diagnosis not present

## 2023-01-14 DIAGNOSIS — E039 Hypothyroidism, unspecified: Secondary | ICD-10-CM | POA: Diagnosis not present

## 2023-01-14 DIAGNOSIS — I10 Essential (primary) hypertension: Secondary | ICD-10-CM | POA: Diagnosis not present

## 2023-01-14 DIAGNOSIS — E785 Hyperlipidemia, unspecified: Secondary | ICD-10-CM | POA: Diagnosis not present

## 2023-01-14 DIAGNOSIS — R7301 Impaired fasting glucose: Secondary | ICD-10-CM | POA: Diagnosis not present

## 2023-01-21 ENCOUNTER — Other Ambulatory Visit: Payer: Self-pay | Admitting: Internal Medicine

## 2023-01-21 DIAGNOSIS — Z1339 Encounter for screening examination for other mental health and behavioral disorders: Secondary | ICD-10-CM | POA: Diagnosis not present

## 2023-01-21 DIAGNOSIS — I10 Essential (primary) hypertension: Secondary | ICD-10-CM | POA: Diagnosis not present

## 2023-01-21 DIAGNOSIS — Z1331 Encounter for screening for depression: Secondary | ICD-10-CM | POA: Diagnosis not present

## 2023-01-21 DIAGNOSIS — R7301 Impaired fasting glucose: Secondary | ICD-10-CM | POA: Diagnosis not present

## 2023-01-21 DIAGNOSIS — E039 Hypothyroidism, unspecified: Secondary | ICD-10-CM | POA: Diagnosis not present

## 2023-01-21 DIAGNOSIS — E785 Hyperlipidemia, unspecified: Secondary | ICD-10-CM | POA: Diagnosis not present

## 2023-01-21 DIAGNOSIS — R82998 Other abnormal findings in urine: Secondary | ICD-10-CM | POA: Diagnosis not present

## 2023-01-21 DIAGNOSIS — Z0000 Encounter for general adult medical examination without abnormal findings: Secondary | ICD-10-CM | POA: Diagnosis not present

## 2024-01-30 DIAGNOSIS — R7301 Impaired fasting glucose: Secondary | ICD-10-CM | POA: Diagnosis not present

## 2024-01-30 DIAGNOSIS — E039 Hypothyroidism, unspecified: Secondary | ICD-10-CM | POA: Diagnosis not present

## 2024-01-30 DIAGNOSIS — E785 Hyperlipidemia, unspecified: Secondary | ICD-10-CM | POA: Diagnosis not present

## 2024-02-06 DIAGNOSIS — R82998 Other abnormal findings in urine: Secondary | ICD-10-CM | POA: Diagnosis not present

## 2024-02-06 DIAGNOSIS — Z0000 Encounter for general adult medical examination without abnormal findings: Secondary | ICD-10-CM | POA: Diagnosis not present

## 2024-02-06 DIAGNOSIS — Z1331 Encounter for screening for depression: Secondary | ICD-10-CM | POA: Diagnosis not present

## 2024-02-06 DIAGNOSIS — Z1339 Encounter for screening examination for other mental health and behavioral disorders: Secondary | ICD-10-CM | POA: Diagnosis not present

## 2024-02-06 DIAGNOSIS — I10 Essential (primary) hypertension: Secondary | ICD-10-CM | POA: Diagnosis not present

## 2024-02-07 ENCOUNTER — Other Ambulatory Visit: Payer: Self-pay | Admitting: Internal Medicine

## 2024-02-07 DIAGNOSIS — E785 Hyperlipidemia, unspecified: Secondary | ICD-10-CM
